# Patient Record
Sex: Female | Born: 2000 | Hispanic: No | Marital: Single | Smoking: Never smoker
Health system: Southern US, Community
[De-identification: ages and names within clinical notes are randomized; demographics above are authoritative.]

## PROBLEM LIST (undated history)

## (undated) DIAGNOSIS — J45909 Unspecified asthma, uncomplicated: Secondary | ICD-10-CM

## (undated) DIAGNOSIS — I38 Endocarditis, valve unspecified: Secondary | ICD-10-CM

## (undated) HISTORY — PX: ASD REPAIR: SHX258

---
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
## 2018-03-04 ENCOUNTER — Ambulatory Visit (HOSPITAL_COMMUNITY)
Admission: RE | Admit: 2018-03-04 | Discharge: 2018-03-04 | Disposition: A | Discharge status: Home / Self Care | Payer: BLUE CROSS/BLUE SHIELD | Source: Ambulatory Visit | Attending: Cardiovascular Disease | Admitting: Cardiovascular Disease

## 2018-03-04 ENCOUNTER — Other Ambulatory Visit (HOSPITAL_COMMUNITY): Payer: Self-pay | Admitting: Cardiovascular Disease

## 2018-03-04 DIAGNOSIS — Q211 Atrial septal defect: Secondary | ICD-10-CM

## 2018-03-04 DIAGNOSIS — Z8774 Personal history of (corrected) congenital malformations of heart and circulatory system: Secondary | ICD-10-CM | POA: Diagnosis not present

## 2018-03-04 DIAGNOSIS — Q2111 Secundum atrial septal defect: Secondary | ICD-10-CM

## 2019-10-29 IMAGING — DX DG CHEST 2V
2 series · 2 of 2 positions shown · non-contrast
Comparison: PA and lateral chest 04/30/2016.

CLINICAL DATA: Asthma symptoms. History of closure of an atrial
septal defect in 6414.

EXAM:
CHEST - 2 VIEW

[chest pa]
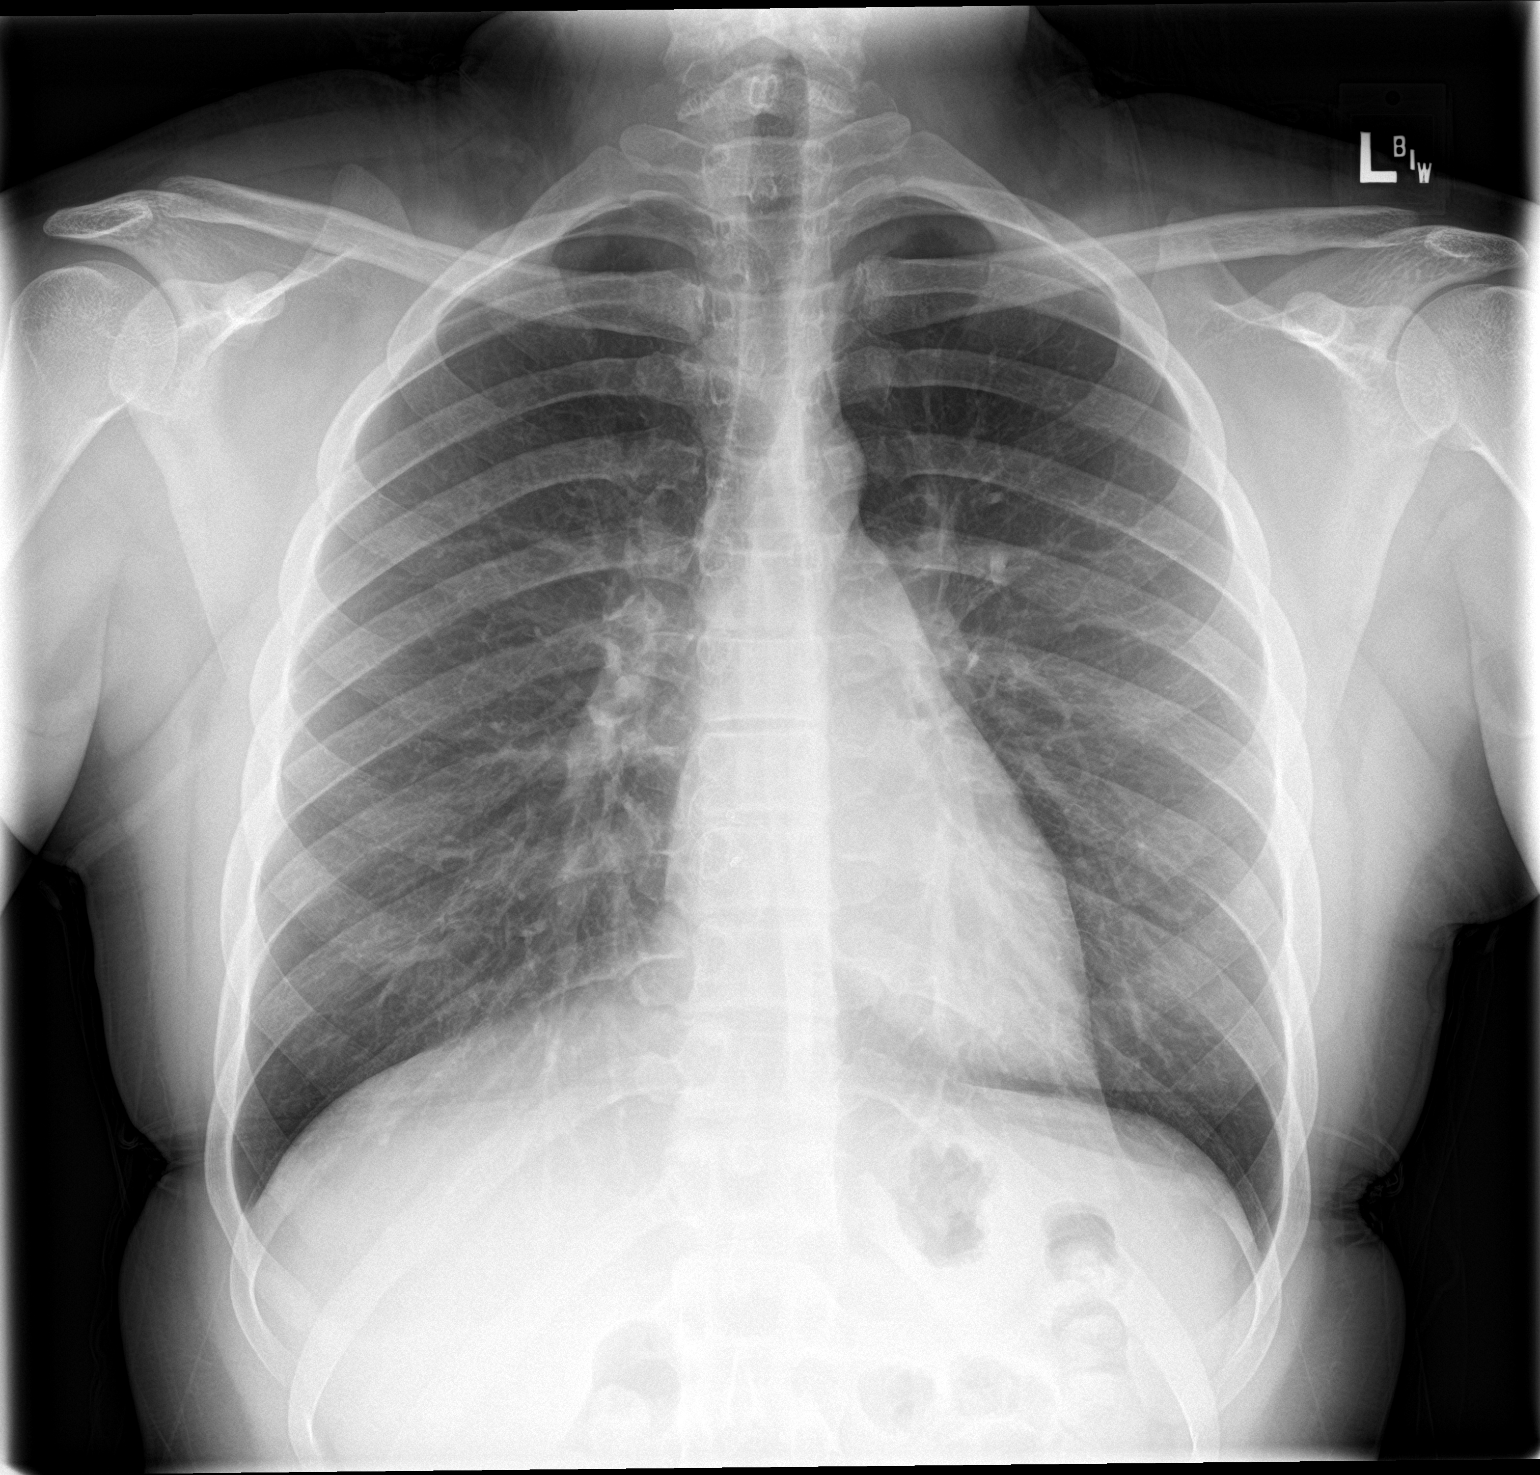

[chest lat]
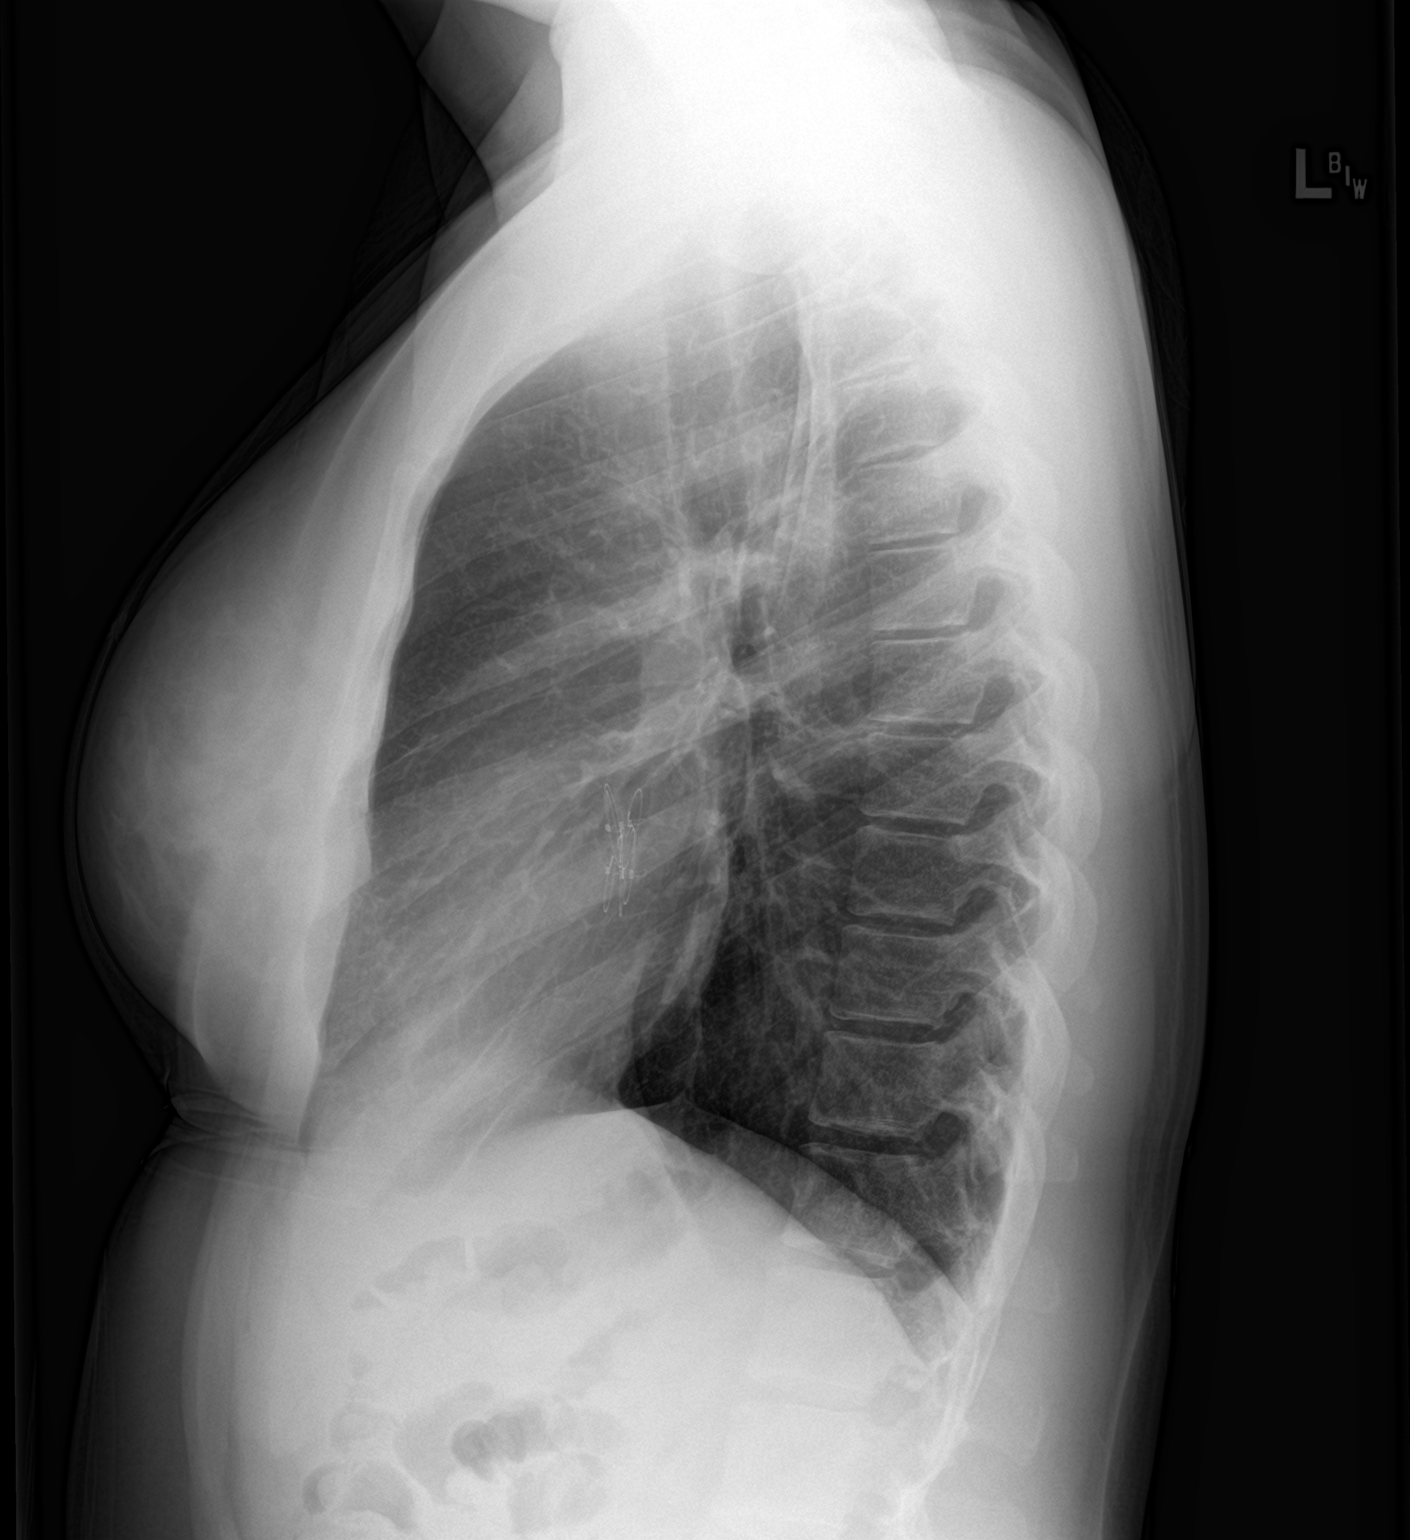

[2 of 2 positions shown; findings below may reference images not displayed]

FINDINGS: The lungs are clear. Heart size is normal. Atrial septal defect
closure device is unchanged in position and appearance. No
pneumothorax or pleural effusion. No bony abnormality.
IMPRESSION: No acute disease.  Stable compared to prior exam.
